# Patient Record
Sex: Female | Born: 1935 | Race: White | Hispanic: No
Health system: Southern US, Community
[De-identification: ages and names within clinical notes are randomized; demographics above are authoritative.]

---

## 1999-05-26 ENCOUNTER — Inpatient Hospital Stay: Admission: RE | Admit: 1999-05-26 | Discharge: 1999-05-27 | Payer: Self-pay | Admitting: *Deleted

## 2004-03-04 ENCOUNTER — Ambulatory Visit: Payer: Self-pay | Admitting: Internal Medicine

## 2004-07-12 ENCOUNTER — Ambulatory Visit: Payer: Self-pay | Admitting: Internal Medicine

## 2004-07-29 ENCOUNTER — Ambulatory Visit: Payer: Self-pay | Admitting: Gastroenterology

## 2005-04-26 ENCOUNTER — Ambulatory Visit: Payer: Self-pay | Admitting: Internal Medicine

## 2005-05-31 ENCOUNTER — Ambulatory Visit: Payer: Self-pay | Admitting: Internal Medicine

## 2005-07-04 ENCOUNTER — Ambulatory Visit: Payer: Self-pay | Admitting: Gastroenterology

## 2006-09-07 ENCOUNTER — Ambulatory Visit: Payer: Self-pay | Admitting: Internal Medicine

## 2007-07-05 ENCOUNTER — Ambulatory Visit: Payer: Self-pay | Admitting: Internal Medicine

## 2008-07-16 ENCOUNTER — Ambulatory Visit: Payer: Self-pay | Admitting: Internal Medicine

## 2009-12-26 ENCOUNTER — Emergency Department: Payer: Self-pay | Admitting: Emergency Medicine

## 2010-06-10 ENCOUNTER — Ambulatory Visit: Payer: Self-pay | Admitting: Ophthalmology

## 2010-06-10 DIAGNOSIS — I119 Hypertensive heart disease without heart failure: Secondary | ICD-10-CM

## 2010-06-28 ENCOUNTER — Ambulatory Visit: Payer: Self-pay | Admitting: Ophthalmology

## 2013-08-23 ENCOUNTER — Ambulatory Visit (HOSPITAL_COMMUNITY)
Admission: EM | Admit: 2013-08-23 | Discharge: 2013-08-23 | Disposition: A | Discharge status: Home / Self Care | Payer: Medicare HMO | Source: Other Acute Inpatient Hospital | Attending: *Deleted | Admitting: *Deleted

## 2013-08-23 ENCOUNTER — Other Ambulatory Visit: Payer: Self-pay | Admitting: Urgent Care

## 2013-08-23 ENCOUNTER — Emergency Department: Payer: Self-pay | Admitting: Emergency Medicine

## 2013-08-23 ENCOUNTER — Ambulatory Visit: Payer: Self-pay | Admitting: Urgent Care

## 2013-08-23 DIAGNOSIS — I714 Abdominal aortic aneurysm, without rupture, unspecified: Secondary | ICD-10-CM | POA: Insufficient documentation

## 2013-08-23 LAB — CBC WITH DIFFERENTIAL/PLATELET
BASOS ABS: 0.1 10*3/uL (ref 0.0–0.1)
Basophil %: 1.2 %
EOS PCT: 2.6 %
Eosinophil #: 0.2 10*3/uL (ref 0.0–0.7)
HCT: 28.1 % — ABNORMAL LOW (ref 35.0–47.0)
HGB: 8.9 g/dL — ABNORMAL LOW (ref 12.0–16.0)
LYMPHS ABS: 1.2 10*3/uL (ref 1.0–3.6)
Lymphocyte %: 14.7 %
MCH: 29.5 pg (ref 26.0–34.0)
MCHC: 31.6 g/dL — ABNORMAL LOW (ref 32.0–36.0)
MCV: 93 fL (ref 80–100)
MONO ABS: 0.7 x10 3/mm (ref 0.2–0.9)
Monocyte %: 9.4 %
Neutrophil #: 5.7 10*3/uL (ref 1.4–6.5)
Neutrophil %: 72.1 %
Platelet: 243 10*3/uL (ref 150–440)
RBC: 3.02 10*6/uL — AB (ref 3.80–5.20)
RDW: 14.4 % (ref 11.5–14.5)
WBC: 7.9 10*3/uL (ref 3.6–11.0)

## 2013-08-23 LAB — COMPREHENSIVE METABOLIC PANEL
AST: 25 U/L (ref 15–37)
Albumin: 3.7 g/dL (ref 3.4–5.0)
Alkaline Phosphatase: 106 U/L
Anion Gap: 4 — ABNORMAL LOW (ref 7–16)
BUN: 38 mg/dL — AB (ref 7–18)
Bilirubin,Total: 0.4 mg/dL (ref 0.2–1.0)
CALCIUM: 9.9 mg/dL (ref 8.5–10.1)
CHLORIDE: 107 mmol/L (ref 98–107)
CREATININE: 1.45 mg/dL — AB (ref 0.60–1.30)
Co2: 26 mmol/L (ref 21–32)
EGFR (Non-African Amer.): 31 — ABNORMAL LOW
GFR CALC AF AMER: 36 — AB
GLUCOSE: 110 mg/dL — AB (ref 65–99)
Osmolality: 284 (ref 275–301)
POTASSIUM: 4.4 mmol/L (ref 3.5–5.1)
SGPT (ALT): 16 U/L (ref 12–78)
Sodium: 137 mmol/L (ref 136–145)
Total Protein: 7.6 g/dL (ref 6.4–8.2)

## 2013-09-09 ENCOUNTER — Inpatient Hospital Stay: Payer: Self-pay | Admitting: Internal Medicine

## 2013-09-09 LAB — COMPREHENSIVE METABOLIC PANEL
ALBUMIN: 2.6 g/dL — AB (ref 3.4–5.0)
ALT: 9 U/L — AB (ref 12–78)
ANION GAP: 6 — AB (ref 7–16)
Alkaline Phosphatase: 92 U/L
BILIRUBIN TOTAL: 0.5 mg/dL (ref 0.2–1.0)
BUN: 31 mg/dL — ABNORMAL HIGH (ref 7–18)
Calcium, Total: 9.4 mg/dL (ref 8.5–10.1)
Chloride: 105 mmol/L (ref 98–107)
Co2: 25 mmol/L (ref 21–32)
Creatinine: 1.43 mg/dL — ABNORMAL HIGH (ref 0.60–1.30)
EGFR (African American): 37 — ABNORMAL LOW
GFR CALC NON AF AMER: 32 — AB
Glucose: 93 mg/dL (ref 65–99)
Osmolality: 278 (ref 275–301)
Potassium: 4.5 mmol/L (ref 3.5–5.1)
SGOT(AST): 12 U/L — ABNORMAL LOW (ref 15–37)
Sodium: 136 mmol/L (ref 136–145)
Total Protein: 7 g/dL (ref 6.4–8.2)

## 2013-09-09 LAB — CBC
HCT: 22 % — AB (ref 35.0–47.0)
HGB: 7 g/dL — ABNORMAL LOW (ref 12.0–16.0)
MCH: 28.2 pg (ref 26.0–34.0)
MCHC: 31.7 g/dL — ABNORMAL LOW (ref 32.0–36.0)
MCV: 89 fL (ref 80–100)
Platelet: 239 10*3/uL (ref 150–440)
RBC: 2.48 10*6/uL — ABNORMAL LOW (ref 3.80–5.20)
RDW: 15 % — ABNORMAL HIGH (ref 11.5–14.5)
WBC: 13.4 10*3/uL — ABNORMAL HIGH (ref 3.6–11.0)

## 2013-09-09 LAB — FERRITIN: Ferritin (ARMC): 107 ng/mL (ref 8–388)

## 2013-09-09 LAB — TROPONIN I: Troponin-I: <0.02 ng/mL

## 2013-09-09 LAB — IRON AND TIBC
Iron Bind.Cap.(Total): 259 ug/dL (ref 250–450)
Iron Saturation: 3 %
Iron: 9 ug/dL — ABNORMAL LOW (ref 50–170)
Unbound Iron-Bind.Cap.: 250 ug/dL

## 2013-09-09 LAB — RETICULOCYTES
Absolute Retic Count: 0.051 10*6/uL (ref 0.019–0.186)
Reticulocyte: 2 % (ref 0.4–3.1)

## 2013-09-09 LAB — URIC ACID: Uric Acid: 4.6 mg/dL (ref 2.6–6.0)

## 2013-09-09 LAB — LACTATE DEHYDROGENASE: LDH: 146 U/L (ref 81–246)

## 2013-09-10 LAB — CBC WITH DIFFERENTIAL/PLATELET
Basophil #: 0 10*3/uL (ref 0.0–0.1)
Basophil %: 0.2 %
EOS ABS: 0.2 10*3/uL (ref 0.0–0.7)
Eosinophil %: 1.7 %
HCT: 24.7 % — ABNORMAL LOW (ref 35.0–47.0)
HGB: 8.1 g/dL — ABNORMAL LOW (ref 12.0–16.0)
Lymphocyte #: 1.2 10*3/uL (ref 1.0–3.6)
Lymphocyte %: 9.9 %
MCH: 28.6 pg (ref 26.0–34.0)
MCHC: 32.7 g/dL (ref 32.0–36.0)
MCV: 88 fL (ref 80–100)
Monocyte #: 1.3 x10 3/mm — ABNORMAL HIGH (ref 0.2–0.9)
Monocyte %: 10.4 %
Neutrophil #: 9.6 10*3/uL — ABNORMAL HIGH (ref 1.4–6.5)
Neutrophil %: 77.8 %
Platelet: 199 10*3/uL (ref 150–440)
RBC: 2.82 10*6/uL — ABNORMAL LOW (ref 3.80–5.20)
RDW: 14.7 % — AB (ref 11.5–14.5)
WBC: 12.3 10*3/uL — ABNORMAL HIGH (ref 3.6–11.0)

## 2013-09-10 LAB — OCCULT BLOOD X 1 CARD TO LAB, STOOL: OCCULT BLOOD, FECES: NEGATIVE

## 2013-09-10 LAB — BASIC METABOLIC PANEL
ANION GAP: 5 — AB (ref 7–16)
BUN: 26 mg/dL — ABNORMAL HIGH (ref 7–18)
CO2: 24 mmol/L (ref 21–32)
CREATININE: 1.29 mg/dL (ref 0.60–1.30)
Calcium, Total: 9.1 mg/dL (ref 8.5–10.1)
Chloride: 107 mmol/L (ref 98–107)
EGFR (African American): 42 — ABNORMAL LOW
EGFR (Non-African Amer.): 36 — ABNORMAL LOW
GLUCOSE: 101 mg/dL — AB (ref 65–99)
Osmolality: 277 (ref 275–301)
POTASSIUM: 3.9 mmol/L (ref 3.5–5.1)
Sodium: 136 mmol/L (ref 136–145)

## 2013-09-10 LAB — SEDIMENTATION RATE: Erythrocyte Sed Rate: >140 mm/hr — ABNORMAL HIGH (ref 0–30)

## 2013-09-10 LAB — FOLATE: FOLIC ACID: 28.4 ng/mL (ref 3.1–100.0)

## 2013-09-11 LAB — CBC WITH DIFFERENTIAL/PLATELET
BASOS PCT: 0.1 %
Basophil #: 0 10*3/uL (ref 0.0–0.1)
EOS ABS: 0 10*3/uL (ref 0.0–0.7)
Eosinophil %: 0 %
HCT: 25.8 % — ABNORMAL LOW (ref 35.0–47.0)
HGB: 8.4 g/dL — AB (ref 12.0–16.0)
Lymphocyte #: 0.5 10*3/uL — ABNORMAL LOW (ref 1.0–3.6)
Lymphocyte %: 5.7 %
MCH: 28.8 pg (ref 26.0–34.0)
MCHC: 32.5 g/dL (ref 32.0–36.0)
MCV: 88 fL (ref 80–100)
Monocyte #: 0.1 x10 3/mm — ABNORMAL LOW (ref 0.2–0.9)
Monocyte %: 1.5 %
NEUTROS PCT: 92.7 %
Neutrophil #: 8.5 10*3/uL — ABNORMAL HIGH (ref 1.4–6.5)
Platelet: 198 10*3/uL (ref 150–440)
RBC: 2.91 10*6/uL — ABNORMAL LOW (ref 3.80–5.20)
RDW: 15.1 % — ABNORMAL HIGH (ref 11.5–14.5)
WBC: 9.2 10*3/uL (ref 3.6–11.0)

## 2013-09-14 LAB — CULTURE, BLOOD (SINGLE)

## 2013-10-09 ENCOUNTER — Telehealth: Payer: Self-pay

## 2013-10-09 NOTE — Telephone Encounter (Signed)
Patient died per Obituary °

## 2013-10-26 DEATH — deceased

## 2014-07-19 NOTE — H&P (Signed)
PATIENT NAME:  Renee Jarvis, Renee Jarvis MR#:  [id number] DATE OF BIRTH:  Jan 05, 1919  DATE OF ADMISSION:  09/09/2013  ADMITTING PHYSICIAN: Renee Baas, MD   PRIMARY CARE PHYSICIAN: Renee Corporal, MD  CHIEF COMPLAINT: Left wrist swelling and weakness.   HISTORY OF PRESENT ILLNESS: Renee Jarvis is a 81 year old elderly Caucasian female with past medical history significant for hypertension, hyperlipidemia, hypothyroidism, reflux disease, who was recently here in the Emergency Room for abdominal pain. CT showed large 8 cm descending thoracic aortic aneurysm and was transferred over to [facility] at the time because of her age and multiple medical problems. They just recommended hospice and observation. The patient was living independently prior to this and since then she moved over with her niece, who is also her healthcare power of attorney. The patient has been ambulating with a walker, however, over the last couple of days she has been extremely weak. She tried to get up to go to the bathroom 2 days back and almost passed out due to dizziness at the time. She was told that she was not having any leaking or bleeding from her aneurysm at [facility].  Her hemoglobin in the ER visit last month, about 3 weeks, ago was 8.9. She has been having some left wrist swelling that she noticed this morning, unable to flex or extend the wrist at this time, so brought over to the ER. She is also noted to have a hemoglobin of 7 today. She is being admitted for possible wrist cellulitis and also acute on chronic anemia.   PAST MEDICAL HISTORY: 1.  Gastroesophageal reflux disease.  2.  Hiatal hernia.  3.  Descending thoracic aortic aneurysm.  4.  Left carotid stenosis.  5.  Peripheral vascular disease, status post left femoral stent.  6.  Hypertension.  7.  Hyperlipidemia.  8.  Anemia of chronic disease.  9.  Hypothyroidism.  10.  Diet-controlled diabetes mellitus.  11.  Chronic right shoulder pain from rotator cuff tear.   PAST  SURGICAL HISTORY: 1.  Stent placement in left leg. 2.  Oophorectomy.  3.  Appendectomy.   ALLERGIES TO MEDICATIONS: No known drug allergies.   CURRENT HOME MEDICATIONS:  1.  Metoprolol 12.5 mg p.o. b.i.d.  2.  Morphine unknown dose, 1 tablet daily.  3.  Omeprazole 20 mg p.o. daily.  4.  Synthroid 88 mcg p.o. daily.   SOCIAL HISTORY: Living with niece, uses walker at baseline. Remote history of smoking, quit more than 15 years back. No alcohol use.   FAMILY HISTORY: Significant for heart disease in the family and sister also had abdominal aortic aneurysm.    REVIEW OF SYSTEMS:  CONSTITUTIONAL: No fever. Positive for fatigue and weakness.  EYES: No blurred vision, double vision, inflammation or glaucoma.  ENT: No tinnitus, ear pain, hearing loss, epistaxis or discharge.  RESPIRATORY: No cough, wheeze, hemoptysis or chronic obstructive pulmonary disease.  CARDIOVASCULAR: No chest pain, orthopnea, edema, arrhythmia, palpitations or syncope.  GASTROINTESTINAL: No nausea, vomiting, diarrhea, abdominal pain, hematemesis or melena.  GENITOURINARY: No dysuria, hematuria, renal calculus, frequency or incontinence.  ENDOCRINE: No polyuria, nocturia, thyroid problems, heat or cold intolerance.  HEMATOLOGY: Positive for anemia.  No easy bruising or bleeding.  SKIN: No acne, rash or lesions.  MUSCULOSKELETAL: Positive for left wrist pain, osteoporosis and significant arthritis.  NEUROLOGICAL: No numbness, weakness, cerebrovascular accident, transient ischemic attack or seizures.  PSYCHOLOGIC: No anxiety, insomnia, depression.   PHYSICAL EXAMINATION: VITAL SIGNS: Temperature 99.3 degrees Fahrenheit, pulse 101, respirations 22,  blood pressure 144/83, pulse ox 100% on oxygen.  GENERAL: Thin-built, ill-nourished elderly female sitting in bed, not in any acute distress.  HEENT: Normocephalic, atraumatic. Pupils equal, round, reacting to light. Anicteric sclerae. Extraocular movements intact.  Oropharynx clear without erythema, mass or exudates.  NECK: Supple, nontender.  No JVD or carotid bruits.  No lymphadenopathy.  LUNGS: Moving air bilaterally. No wheeze or crackles. No use of accessory muscles for breathing.  CARDIOVASCULAR: S1, S2. There is a 3/6 systolic murmur. No rubs or gallops.  ABDOMEN: Soft, nontender, nondistended. No hepatosplenomegaly. Normal bowel sounds.  EXTREMITIES: No pedal edema. No clubbing or cyanosis; 2+ dorsalis pedis pulses palpable bilaterally. Left wrist is swollen and also the left fingers. No erythema noted. Tenderness on the  wrist, especially on the palmar side and also tender to touch in the thenar eminence.  NEUROLOGIC: Cranial nerves intact. No focal motor or sensory deficits.  PSYCHOLOGICAL: The patient is alert, awake, oriented x 3.   LABORATORY, DIAGNOSTIC AND RADIOLOGICAL DATA:  1.  WBC 13.4, hemoglobin 7.3, hematocrit 22.0, platelet count 239.  2.  Sodium 136, potassium 4.5, chloride 105, bicarbonate 25, BUN 31, creatinine 1.4, glucose 93 and calcium of 9.4.  3.  ALT 9, AST 12, alkaline phosphatase 92, total bilirubin 0.4, albumin of 3.6. Uric acid 4.6. Troponin less than 0.02.  4.  Wrist x-ray showing moderate degenerative changes without acute fracture.  5.  Ultrasound of the left upper extremity showing no evidence of any deep venous thrombosis.   ASSESSMENT AND PLAN: A 81 year old elderly female with thoracic aneurysm, recently-diagnosed anemia of chronic disease, reflux, peripheral vascular disease, followed by [facility], brought in for weakness, dizziness, left wrist swelling and pain.  1.  Left wrist swelling, Wrist X ray with arthritis changes. Ultrasound is negative for clot. Either pseudogout or cellulitis. Blood cultures ordered.  IV Ancef. If no improvement, steroids likely.  2.  Anemia, acute on chronic. Last month was 8.9, now it is 7.0. Takes Excedrin for arthritis p.r.n. Placed on IV Protonix. Gastroenterology consult.  Not  sure if she is a good candidate for any procedures. One unit transfusion, recheck. If hemoglobin is stable and no active bleed, likely discharge.  3.  Abdominal aortic aneurysm, descending thoracic aneurysm. No bleeding or leaking. Followed by hospice at home.  4.  Hypothyroidism. On Synthroid.  5.  Hypertension. Continue home medications.  6.  CODE STATUS: DO NOT RESUSCITATE. Discussed with her niece at bedside.   TIME SPENT ON ADMISSION: 50 minutes.    ____________________________ Renee Baasadhika Meryl Hubers, MD rk:cs D: 09/09/2013 20:08:00 ET T: 09/09/2013 20:35:10 ET JOB#: [id number]  cc: Renee Baasadhika Helmer Dull, MD, <Dictator> Renee CorporalFayegh H. Jadali, MD Renee BaasADHIKA Dnya Hickle MD ELECTRONICALLY SIGNED 09/16/2013 15:59

## 2014-07-19 NOTE — Consult Note (Signed)
Brief Consult Note: Patient was seen by consultant.   Consult note dictated.   Orders entered.   Comments: 1 acute left wrist and hand synovitis,chondrocalcinosis on xray, c/w pseudogout 2 anemia rec wrist splint, prednisone dose x1, may need taper.  Electronic Signatures: Royann ShiversKernodle, Jr., Helen HashimotoGeorge Wallace (MD)  (Signed 16-Jun-15 19:05)  Authored: Brief Consult Note   Last Updated: 16-Jun-15 19:05 by Royann ShiversKernodle, Jr., Helen HashimotoGeorge Wallace (MD)

## 2014-07-19 NOTE — Consult Note (Signed)
Brief Consult Note: Diagnosis: History of melena for 3-4 days 3 weeks ago, acute on chronic anemia.   Patient was seen by consultant.   Consult note dictated.   Comments: Maximize ppi bid. Hold on carafate as SE is constipation. Keep Hgb >8. Monitor labs. No NSAIDS. She is not a luminal candidate. This case d/w Dr. Mechele CollinElliott in collaboration of care.  Electronic Signatures: Rowan BlaseMills, Tamsen Reist Ann (NP)  (Signed 16-Jun-15 14:08)  Authored: Brief Consult Note   Last Updated: 16-Jun-15 14:08 by Rowan BlaseMills, Lewi Drost Ann (NP)

## 2014-07-19 NOTE — Consult Note (Signed)
PATIENT NAME:  Renee Jarvis, Renee Jarvis MR#:  [id number] DATE OF BIRTH:  02-Sep-1918  DATE OF CONSULTATION:  09/10/2013  REFERRING PHYSICIAN:  Enid Baas, MD CONSULTING PHYSICIAN: Lynnae Prude, MD / Ranae Plumber. Arvilla Market, ANP (Adult Nurse Practitioner)  PRIMARY CARE PHYSICIAN: Sherrie Mustache, MD  REASON FOR CONSULTATION: Melena with anemia.   HISTORY OF PRESENT ILLNESS: This 81 year old patient with history of hypertension, hyperlipidemia, hypothyroidism, and an 8 cm descending thoracic aortic aneurysm was brought to the Emergency Room by her family for left arm pain. The patient has also had progressive weakness over the last couple of days. The patient has had no specific injury to the hand. She has had evaluation with negative findings. The hand pain is so severe that it does radiate up the arm, and it has required the patient to have pain medication. This patient was extremely functional until about 3 weeks ago. She then developed problems with this arm pain. She also tells me she passed black stool about 4 weeks ago that she noticed for about 3 or 4 days and then it spontaneously cleared. During the time with the black stool, she had mild abdominal discomfort that would wax and wane. Her great niece who is present, reports the patient has reported intermittent left lower quadrant abdominal discomfort for years. No recent exacerbation. The patient was hospitalized here 08/23/2013 and had a hemoglobin of 8.9. She presented this admission with a hemoglobin of 7. The family tells me her rectal exam in the Emergency Room was negative for blood. The patient currently denies any abdominal complaints. She has received 1 unit PRBC for iron deficiency anemia and says she is feeling better. GI has been asked to see her regarding further evaluation and management.   The patient does have a history of adenomatous colon polyps and her most recent colonoscopy was performed in 2006. She aged out of surveillance  colonoscopy.   PAST MEDICAL HISTORY: 1.  Gastroesophageal reflux disease.  2.  Hiatal hernia.  3.  Descending thoracic aortic aneurysm, 8 cm, recently evaluated by Renee Jarvis with recommendations for hospice care.  4.  Left carotid stenosis.  5.  Peripheral vascular disease, status post left femoral stent.  6.  Hypertension.  7.  Hyperlipidemia.  8.  Anemia of chronic disease.  9.  Hypothyroidism.  10.  Diet-controlled diabetes mellitus.  11.  Chronic right shoulder pain from rotator cuff tear. 12.  Adenomatous colon polyps including a serrated adenoma from the rectum, 2006.   PAST SURGICAL HISTORY:  1.  Stent placement, left leg.  2.  Oophorectomy.  3.  Appendectomy.   CURRENT HOME MEDICATIONS:  1.  Metoprolol 12.5 mg twice daily.  2.  Morphine, unknown dose, p.r.n. pain, hospice medication.  3.  Omeprazole 20 mg daily.  4.  Synthroid 88 mcg daily.  5.  Lorazepam p.r.n., hospice medication.   ALLERGIES: No known drug allergies.   SOCIAL HISTORY: Living with niece. Uses walker at baseline. Remote history of tobacco. Negative alcohol.   FAMILY HISTORY: Aunt with history of cancer, possible breast cancer. No family history of colon cancer.   REVIEW OF SYSTEMS: Ten systems reviewed. Positive for fatigue, weakness. No chest pain, shortness of breath. Left breast, chest, arm, shoulder and hand discomfort, some swelling of the arm, has been investigated with negative DVT, negative fracture. No orthopedic evaluation. The patient denies injury or fall. Positive for weakness and fatigue and decreased functionality requiring significant help by her niece. The patient has recently been placed  in hospice care as her niece was having difficulty meeting all of her needs at home. No surgical recommendation for the patient's aneurysm.   PHYSICAL EXAMINATION: VITAL SIGNS: 98.1, 74, 18, 142/99, pulse oximetry 97% on 3 liters nasal cannula.  GENERAL: Elderly, frail very thin Caucasian female  resting in bed. She is actively involved in conversation and visiting with her great-niece.  HEENT: Head is normocephalic. Conjunctiva is pale pink.  NECK: Supple. Trachea is midline.  LUNGS: Have good breath sounds bilateral, no wheezing or crackles or rhonchi noted.  CARDIOVASCULAR: S1, S2. Positive systolic murmur is present.  ABDOMEN: Soft and nondistended, nontender in all quadrants. Negative organomegaly. Digital rectal exam deferred.  EXTREMITIES: Lower extremities without edema, some muscle mass wasting noted.  NEUROLOGIC: The patient's speech is clear. She is very alert, oriented, good historian, weakly moves about in bed.   DIAGNOSTIC DATA: Serum iron is 9. BUN 31, creatinine 1.43, albumin 2.6, AST 12, total bilirubin 0.5, alkaline phosphatase 92. Uric acid 4.6, normal range. Ferritin 107, iron saturation 3%. LDH 146. Troponin negative x1. WBC 13.4, hemoglobin 7, hematocrit 22, platelet count 239,000, MCV 89, MCH 28, RDW 15. Retic count 2. Direct Coombs negative. Blood culture negative x2.   Repeat laboratory studies today with BUN 26, creatinine 1.29. After 1 unit PRBC, hemoglobin is up to 8.1. WBCs are 12.3.   Radiology: Left wrist x-ray negative for fracture. Positive for moderate degenerative change.   Ultrasound of the left arm: Negative for DVT.  CT of the abdomen and pelvis without contrast performed in ER visit 08/23/2013 notable for a descending thoracic aorta measuring 8.1 x 7.2 cm, which was a dramatic change compared to the prior study. There is aneurysmal dilatation in the distal abdominal aorta with tortuosity. The maximal transverse diameter in the triple-A is 3.6 x 3.2 cm. There is a cystic mass between the uterus and rectum measuring 8.1 x 7.5 cm. Multiple sigmoid diverticula without diverticulosis. No bowel obstruction. There is extensive arthropathy throughout the lumbar spine. No blastic or lytic bone lesions. The patient was subsequently referred to [facility] and reportedly  had a second  CT of the abdomen with contrast at that facility and the niece reports no evidence of aneurysm leaking.   IMPRESSION: The patient presents with a history of melena about 4 weeks ago. This correlates with her 3 week history of decreased functional status, increasing weakness, fatigue, and needing more assistance at home. The patient gives a good history that she had about 3 to 4 days of black stools that spontaneously resolved back to normal. She has had some mild intermittent left lower quadrant discomfort for many years and that is unchanged. She presents today asymptomatic. CT studies have made no mention of abnormality in the bowel. The patient did have some upper left chest, arm, and shoulder discomfort that has been her main chief complaint recently. That does not seem to be gastrointestinally related. She has had slight decrease in appetite, staff reports she is not eating well and her albumin is lower than baseline. The patient denies any nausea or vomiting, heartburn or reflux or upper abdominal pain at this time. The etiology for her melena a few weeks ago certainly could be an upper gastrointestinal focus. The patient has multiple vascular problems and that can give her risk for vascular gastrointestinal blood loss. She has noted no maroon or bright red blood. She has not had a bowel movement since admission. She is having a tendency towards constipation which is a  waxing and waning problem for her. She had her last colonoscopy in 2006 with history of adenomatous polyps and a serrated adenomatous polyp in the rectum. Possibility of colorectal neoplasm as etiology. Her iron deficiency anemia needs to be considered. The patient is now on hospice care as she is deemed not to be a surgical candidate given advanced age and overall health.   PLAN: Recommend aggressive treatment for potential of upper gastrointestinal bleed with double dose proton pump inhibitor, consider Carafate if needed.  She has received 1 unit PRBC and hemoglobin has improved. Would recommend monitoring hemoglobin and iron supplementation to try to build back up her blood if she is able to tolerate the medication. She is not a luminal candidate in the absence of aggressive active gastrointestinal bleed. I will discuss this case with Dr. Mechele CollinElliott in collaboration of care. Thank you for the consultation.   This services provided by Cala BradfordKimberly A. Arvilla MarketMills, MS, APRN, BC, ANP under collaborative agreement with Lynnae Prudeobert Elliott, MD.   ____________________________ Ranae PlumberKimberly A. Arvilla MarketMills, ANP (Adult Nurse Practitioner) kam:sb D: 09/10/2013 13:52:59 ET T: 09/10/2013 14:13:51 ET JOB#: [id number]  cc: Cala BradfordKimberly A. Arvilla MarketMills, ANP (Adult Nurse Practitioner), <Dictator> Ranae PlumberKimberly A. Suzette BattiestMills RN, MSN, ANP-BC Adult Nurse Practitioner ELECTRONICALLY SIGNED 09/11/2013 8:47

## 2014-07-19 NOTE — Consult Note (Signed)
PATIENT NAME:  Renee Jarvis, Renee Jarvis MR#:  [id number] DATE OF BIRTH:  1918-12-25  DATE OF CONSULTATION:  09/10/2013  CONSULTING PHYSICIAN:  Dessie ComaGeorge Wallace Kernodle Jr., MD  REASON FOR CONSULT: Left wrist pain.   REPORT OF CONSULTATION: A 81 year old white female. She has a history of thoracic aneurysm, reflux, diabetes. She has been on hospice care. She has had prior right rotator cuff surgery. She felt bad in the last few days, called the hospice nurse, who stayed up with her one night. Left wrist started swelling. No significant fever. Right hand did not swell. No knee swelling or foot swelling. She has not had podagra. She thinks she may have had this before. She came to the Emergency Room, where she was more anemic. She does have a thoracic aneurysm. She had an x-ray of the left hand. She has not had significant fever. She has had evaluation by GI. She has not had any procedures. Her white count was 12,000, hemoglobin 8, platelets 199,000, creatinine 1.29, sedimentation rate 140, uric acid 4. X-ray was read as degenerative changes.  PAST MEDICAL HISTORY: Aneurysm reflux, diabetes, peripheral vascular disease, hypertension, rotator cuff disease.  REVIEW OF SYSTEMS:  Otherwise negative.   PHYSICAL EXAMINATION: GENERAL: A pleasant female. No acute distress. Unaccompanied in the room.  VITAL SIGNS: Temperature is 97, blood pressure 142/90, respiratory rate 18, pulse ox 96%, pulse 66.  HEENT: Sclerae clear.  CHEST: Clear.  HEART: No significant murmur.  ABDOMEN: Nontender.  EXTREMITIES: No significant edema.  MUSCULOSKELETAL: Decreased range of cervical spine. The right shoulder has decreased abduction, external rotation, but no effusion. Left shoulder without effusion. Right hand without synovitis. Hypertrophic changes thumb CMC. Left hand with synovitis, left wrist. The PIPs and MCPs were swollen, the hands diffusely swell. She has pain with flexion and extension. There is minimal erythema. She has  good distal pulses. Hips move well. Knees without effusions. MTPs nontender.   STUDIES: X-ray reviewed by me shows degenerative changes radiocarpal joint and thumb CMC, but she has calcification of the lateral wrist consistent with chondrocalcinosis.   IMPRESSION: 1.  Acute left wrist and hand tenosynovitis, most consistent with pseudogout, CPPD.  2.  Anemia, with recent worsening.   3.  Old right rotator cuff disease and peripheral vascular disease.   PLAN: I do not think it is imperative that she have an aspiration. Injection of the wrist would only help the wrist, it would not help the rest of the hand, so would favor wrist splinting and  a steroid taper. She has PPI protection.    ____________________________ Dessie ComaGeorge Wallace Kernodle Jr., MD gwk:mr D: 09/10/2013 19:10:55 ET T: 09/10/2013 19:43:25 ET JOB#: [id number]  cc: Dessie ComaGeorge Wallace Kernodle Jr., MD, <Dictator> Webb SilversmithGEORGE W KERNODLE J MD ELECTRONICALLY SIGNED 09/11/2013 12:50

## 2014-07-19 NOTE — Discharge Summary (Signed)
PATIENT NAME:  Renee Jarvis, Renee Jarvis MR#:  [id number] DATE OF BIRTH:  1918-04-04  DATE OF ADMISSION:  09/09/2013 DATE OF DISCHARGE:  09/11/2013  ADMITTING DIAGNOSIS: Arthritis versus cellulitis.   DISCHARGE DIAGNOSES: 1.  Left hand wrist synovitis, suspected  pseudogout.  2.  Acute on chronic posthemorrhagic anemia, status post 1 unit of packed red blood cell transfusion during this admission.  3.  Gastrointestinal bleed of unclear etiology at this time.  4.  Acute renal failure due to dehydration, resolved on IV fluid administration.  5.  Moderate malnutrition. 6.  Malignant essential hypertension.  7.  History of thoracic aortic aneurysm.   8.  Hypothyroidism.   DISCHARGE CONDITION: Stable.   DISCHARGE MEDICATIONS: The patient is to continue:  1.  Synthroid 88 mcg p.o. daily.  2.  Metoprolol tartrate 12.5 mg twice daily.  3.  Morphine 1 tablet daily as needed.  4.  Omeprazole 40 mg p.o. twice daily.  5.  Colchicine 0.6 mg p.o. daily.  6.  Amlodipine 2.5 mg p.o. daily.  7.  Prednisone taper at 60 mg p.o. once on September 12, 2013, then taper x 10 mg every 2 days until stopped.  8.  Iron gluconate 240 mg twice daily.  9.  Colace 100 mg p.o. twice daily.  10.  Senna 8.6 mg once daily at as needed.   HOME OXYGEN: None.   DIET: Two grams salt, low-fat, low-cholesterol with Ensure 3 times a day. Regular consistency.   ACTIVITY LIMITATIONS: As tolerated.   REFERRAL: To hospice at home.    FOLLOWUP APPOINTMENT: With Dr. Dario GuardianJadali in 2 days after discharge.   CONSULTANTS: Care management, social work, Dr. Gavin PottersKernodle.   RADIOLOGIC STUDIES: Left wrist complete x-ray, September 09, 2013, revealing moderate degenerative changes of the wrist without acute fracture. Doppler ultrasound of left upper extremity September 09, 2013, revealing no evidence of deep vein thrombosis.   HISTORY OF PRESENT ILLNESS: The patient is a 81 year old Caucasian female with past medical history significant for history of thoracic  aortic aneurysm reaching 8 cm, history of hypertension, hyperlipidemia, who is under hospice care at home, presents to the hospital with complaints of left wrist swelling as well as weakness. Please refer to Dr. Prudencio PairKalisetti's admission note on September 09, 2013. On arrival to the hospital, the patient was noted to be anemic with a hemoglobin level of 7. She also admits of having black-looking stools a few weeks ago. On arrival to the Emergency Room, the patient's vital signs:  Temperature was 99.8, pulse was 101, respiration rate was 22, blood pressure 144/83, saturation was 100% on oxygen therapy. Physical examination was remarkable for 3 out of 6 systolic murmur on heart exam. Otherwise, the patient also showed left wrist swelling  as well as MCP swelling and hand was diffusely swollen, also painfull with flexion as well as extension and no erythema. The patient was admitted to the hospital for further evaluation. Her blood cultures were taken and she was initiated on antibiotic therapy because of concerns of possible cellulitis.   On arrival to the hospital, the patient's labs revealed elevation of BUN as well as creatinine to 31 and 1.43, otherwise BMP was unremarkable. The patient's iron level was low at 9, iron saturation was noted to be only 3, ferritin level of 107. Liver enzymes were remarkable for low albumin of  2.6. White blood cell count was elevated at 13.4, hemoglobin was 7.0 as mentioned above and platelet count was 239. Reticulocyte count was normal at 2.0,  absolute reticulocyte count was 0.051.   HOSPITAL COURSE:  The patient was admitted to the hospital for further evaluation and evaluation of her anemia as well as treatment of her left hand and wrist swelling and discomfort. The patient was evaluated by Dr. Gavin Potters for left wrist swelling and decreased range of motion and pain. Dr. Gavin Potters saw the patient in consultation on 09/10/2013. He felt that the patient has acute left wrist as well as hand  tenosynovitis most consistent with pseudogout CPPD. He gave the patient prednisone and recommended wrist splinting.  The patient was initiated on prednisone as well as colchicine with good results. She is to continue prednisone taper. She is to continue colchicine, closely following her kidney function. She is to follow up with her primary physician for further recommendations.   In regards to anemia, we consulted gastroenterologist, Dr. Mechele Collin.  Dr. Mechele Collin did not feel the patient is a candidate for luminal investigation. He recommended to treat patient as if she has peptic ulcer disease with proton pump inhibitor. The patient was advised not to use any nonsteroidal anti-inflammatory medications. She is being discharged to home with omeprazole 40 mg twice-daily dose. She was also recommended to continue iron supplementation. She is to follow up with her primary care physician and have her labs rechecked.   In regards to moderate malnutrition, the patient was advised to use Ensure. In regards to malignant essential hypertension, the patient's blood pressure medications were advanced, adding 2.5 mg of amlodipine daily with good results.   On the day of discharge, the patient's vital signs: Temperature was 98.5, pulse was 90, respiratory rate was 19, blood pressure 130/80, saturation was 98% on 3 liters of oxygen through nasal cannula.   In regards to renal insufficiency and dehydration, it was felt to be dehydration-related and  resolved on IV fluid administration. For her chronic medical problems such as thoracic aortic aneurysm as well as hypothyroidism, the patient is to continue her outpatient management and hospice care. The patient is being discharged in stable condition with the above-mentioned medications and followup.   TIME SPENT: 40 minutes on this patient.   ____________________________ Katharina Caper, MD rv:cs D: 09/11/2013 19:45:36 ET T: 09/11/2013 20:05:46 ET JOB#: [id number]  cc: Katharina Caper, MD, <Dictator> Marlyn Corporal, MD RIMA VAICKUTE MD ELECTRONICALLY SIGNED 09/21/2013 18:44

## 2014-07-19 NOTE — Consult Note (Signed)
Pt seen, case discussed with Fransico SettersKim Mills, NP.  Pt with recent melena for 3-4 days, no vomiting, no current bleeding.  Conjunctiva look pale. May need more blood.  No plans to do EGD given age and multiple aneurysms.  Treat as if PUD  with PPI.  Electronic Signatures: Scot JunElliott, Robert T (MD)  (Signed on 16-Jun-15 17:22)  Authored  Last Updated: 16-Jun-15 17:22 by Scot JunElliott, Robert T (MD)

## 2016-04-19 IMAGING — CT CT ABD-PELV W/O CM
2 of 4 series · 15 of 46 positions shown, 17 images · non-contrast
Comparison: July 04, 2005 CT abdomen posterior to the uterus
measuring 7.3 x 7.6 cm.

CLINICAL DATA: Anemia and lower abdominal pain

EXAM:
CT ABDOMEN AND PELVIS WITHOUT CONTRAST
TECHNIQUE: Multidetector CT imaging of the abdomen and pelvis was performed
following the standard protocol without IV contrast. Intravenous
contrast was not administered due to decreased glomerular filtration
rate. Oral contrast was administered.

[Series 2: routine abd pel without · axial · non-contrast · 0.68mm/px · z∈[-1028,-684]mm · 12 of 80 slices shown, 14 images]
[im 7/80  soft-tissue]
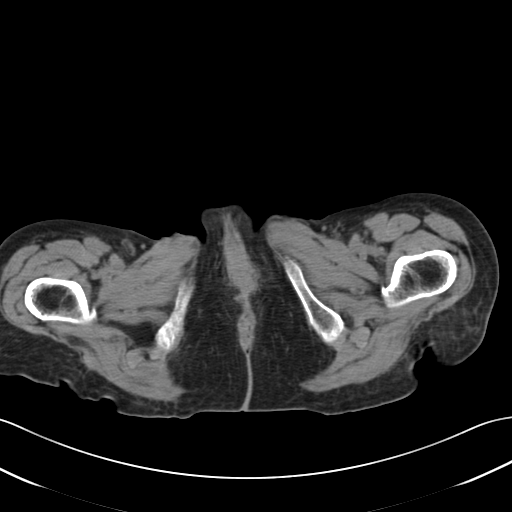
[im 7/80  bone]
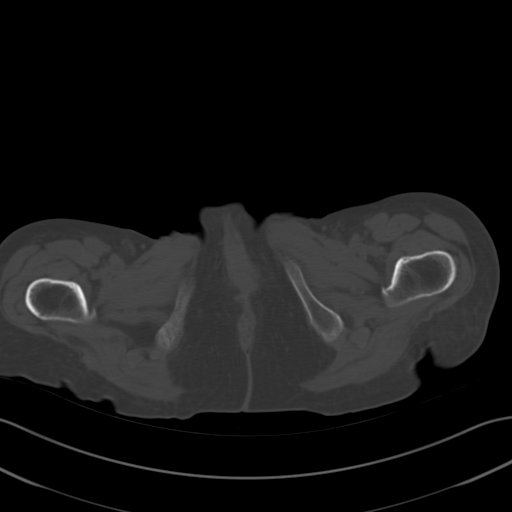
[im 13/80  soft-tissue]
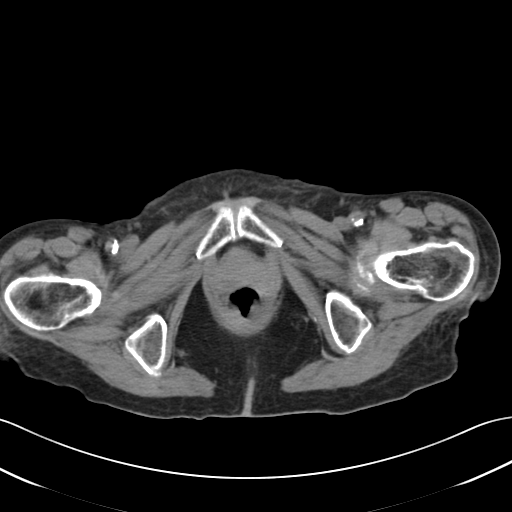
[im 19/80  soft-tissue]
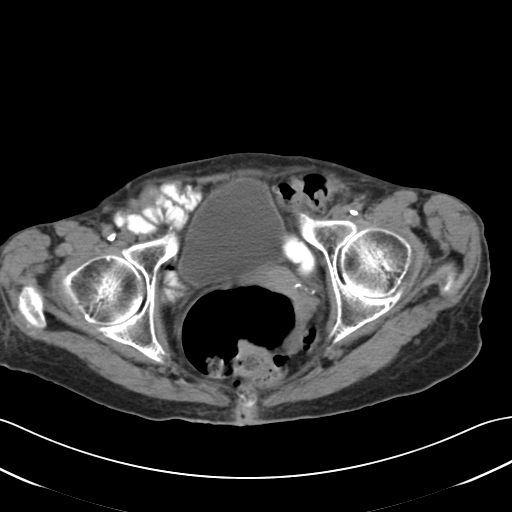
[im 26/80  soft-tissue]
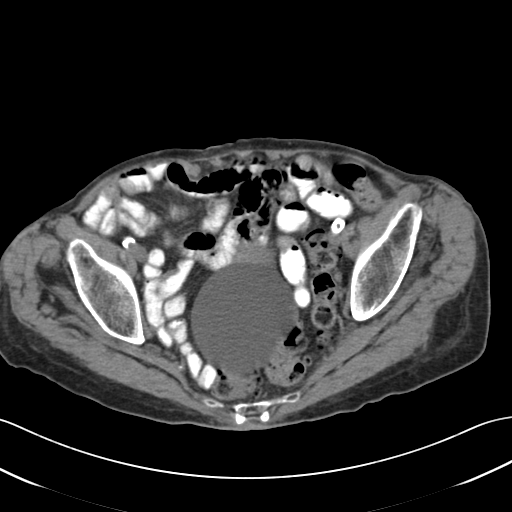
[im 32/80  soft-tissue]
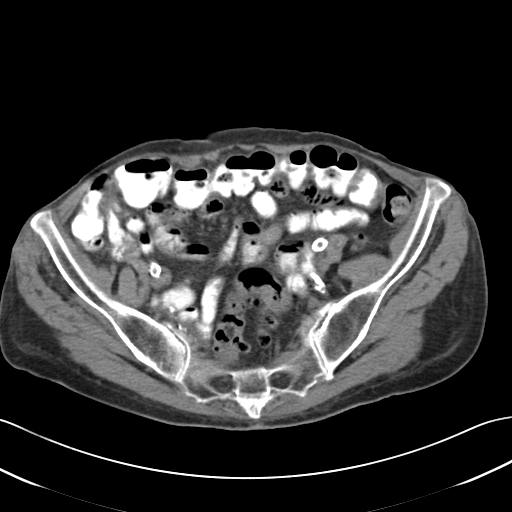
[im 38/80  soft-tissue]
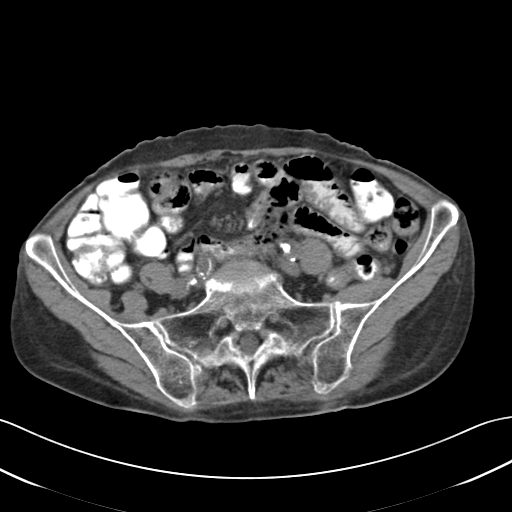
[im 45/80  soft-tissue]
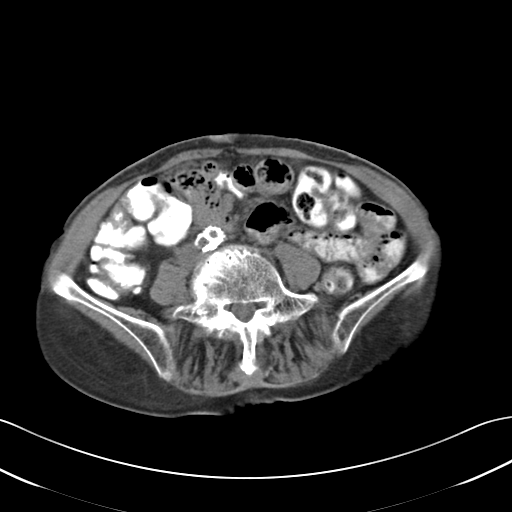
[im 51/80  soft-tissue]
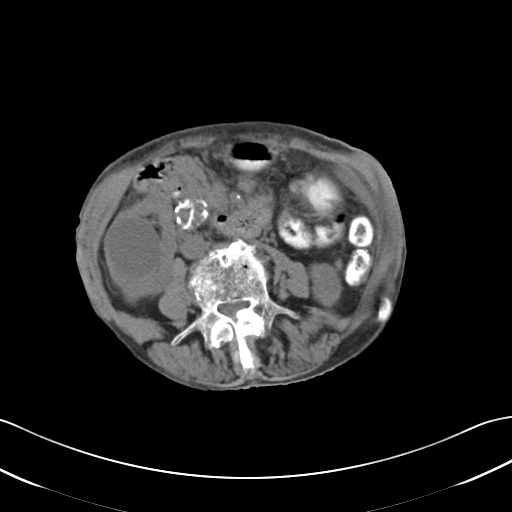
[im 57/80  soft-tissue]
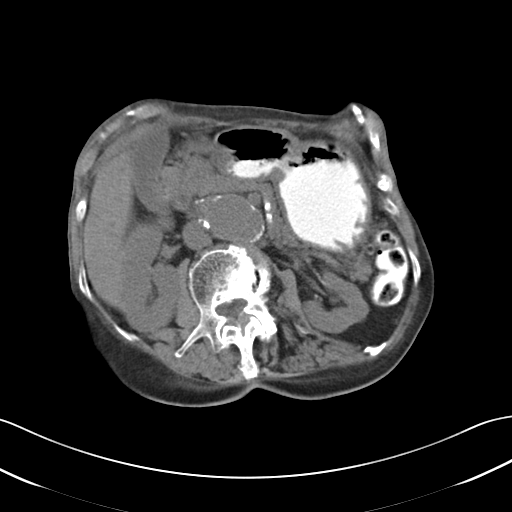
[im 57/80  bone]
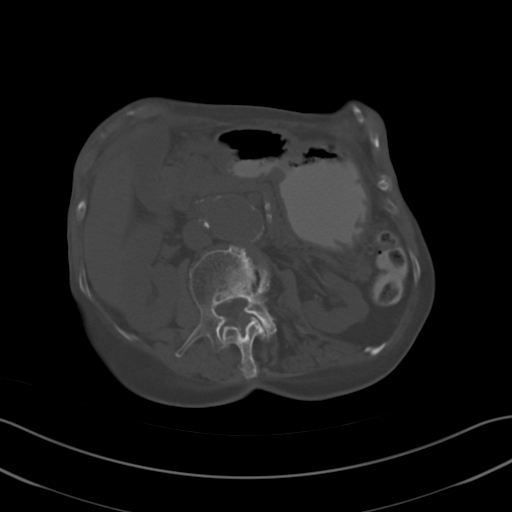
[im 64/80  soft-tissue]
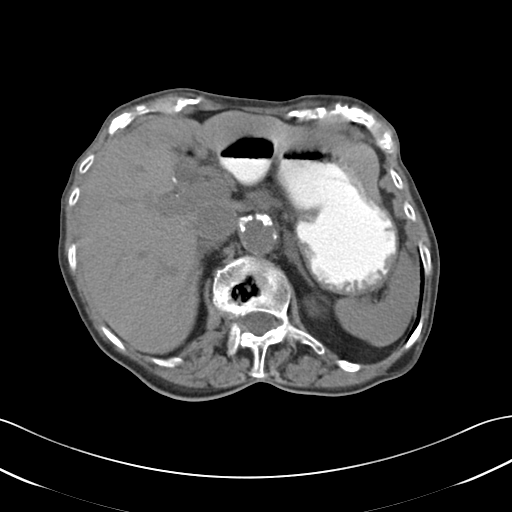
[im 70/80  soft-tissue]
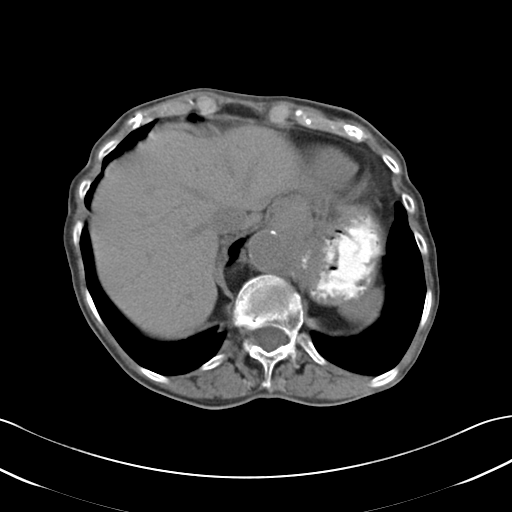
[im 76/80  soft-tissue]
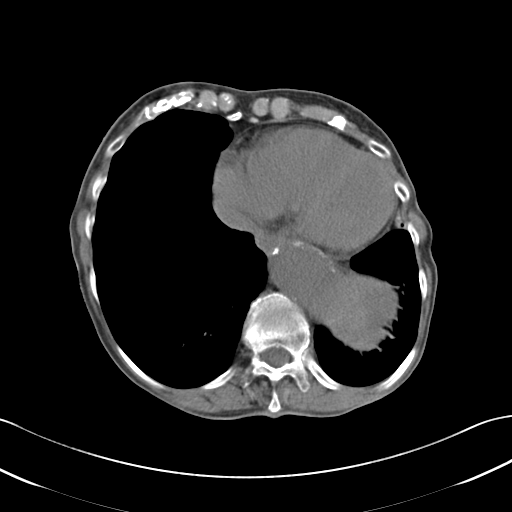

[Series 5: cor routine abd pel wo · coronal · 0.67mm/px · 3 of 112 slices shown]
[im 38/112  soft-tissue]
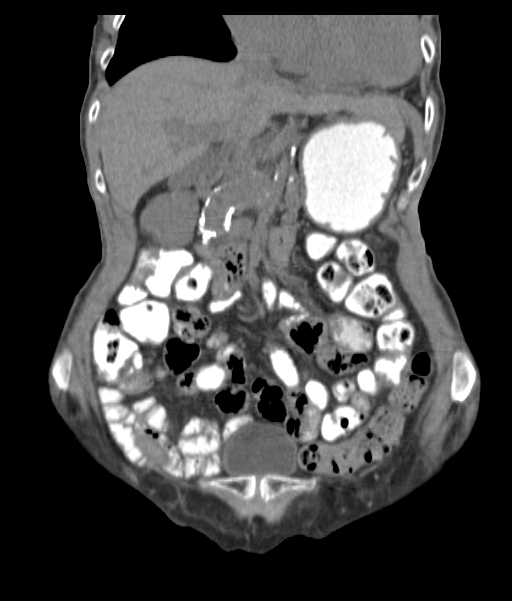
[im 50/112  soft-tissue]
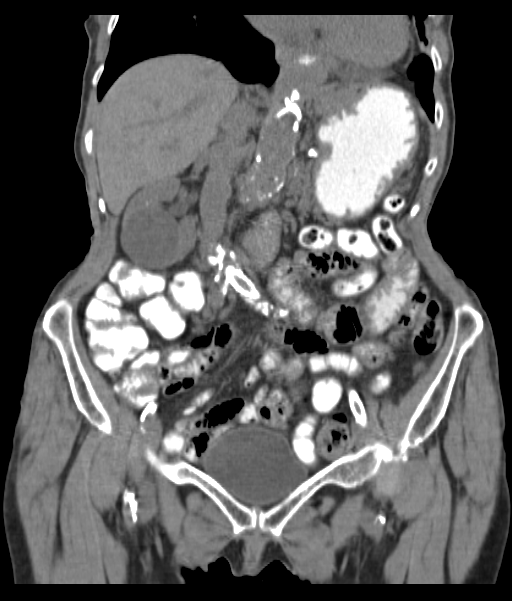
[im 62/112  soft-tissue]
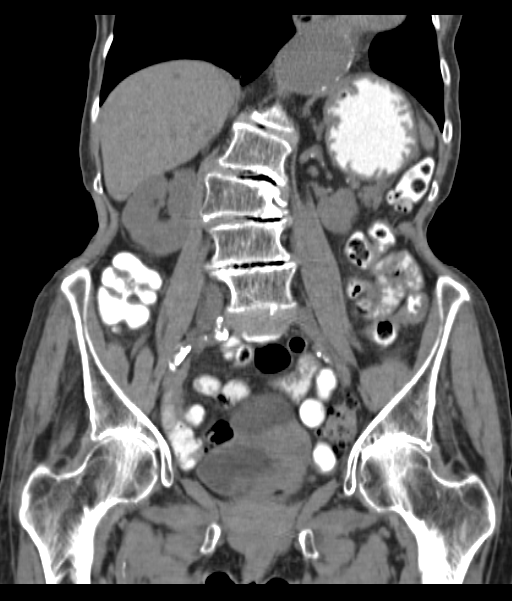

[15 of 46 positions shown; findings below may reference images not displayed]

No other pelvic mass is seen. The urinary
bladder is midline with normal wall thickness.
FINDINGS: There is mild scarring in the left lung base. Lung bases otherwise
appear clear.

No focal liver lesions are identified on this noncontrast enhanced
study. There does appear to be some fatty change near the fissure
for the ligamentum teres. There is no appreciable biliary duct
dilatation. The gallbladder wall is not thickened.

Spleen, pancreas, and adrenals appear unremarkable.

There is a cyst within the mid portion of the right kidney measuring
4.1 x 3.9 cm. No other renal masses are identified on this
noncontrast enhanced study. The right kidney is larger than the left
kidney, a stable finding compared to the prior study. There is no
hydronephrosis on either side. There is a 2 mm nonobstructing
calculus in the mid right kidney. There are no ureteral calculi on
either side. There are multiple foci of arterial vascular
calcification on the left.

The aorta is tortuous with atherosclerotic change. There is marked
aneurysmal dilatation in the descending thoracic aorta measuring
by 7.2 cm, a dramatic change compared to the prior study. There is
aneurysmal dilatation in the distal abdominal aorta with tortuosity.
The maximum transverse diameter of the abdominal aortic aneurysm is
3.6 x 3.2 cm.

In the pelvis, there is a cystic mass between the uterus and rectum
measuring 8.1 x 5.2 x 7.5 cm. No other pelvic mass is seen. There
are multiple sigmoid diverticula without diverticulitis.

There is no bowel obstruction.  No free air or portal venous air.

There is no ascites, adenopathy, or abscess in the abdomen or
pelvis. There is extensive arthropathy throughout the lumbar spine.
There is thoracolumbar dextroscoliosis. There are no blastic or
lytic bone lesions.
IMPRESSION: There is a large descending thoracic aortic aneurysm, incompletely
visualized. There are areas of increased attenuation within this
large aneurysm. Question hemorrhage/contain rupture in this area.
Thoracic CT or MR angiography to further assess this area may be
warranted, although the patient's renal function must of course be
taken into consideration with respect to further imaging.

Large cystic mass in the posterior midline pelvis. Major
differential considerations for this mass include ovarian cyst
versus cystadenoma.

There are sigmoid diverticula without diverticulitis.

No bowel obstruction.  No abscess.

There is a distal abdominal aortic aneurysm.

There is a stable prominent cyst in the right kidney. The left
kidney is smaller the right kidney. Question a degree of renal
artery stenosis on the left.

Critical Value/emergent results were called by telephone at the time
of interpretation on 08/23/2013 at [DATE] to Dr. Oxendine, Sorin
physician at [HOSPITAL], who verbally acknowledged these results. I am
sending this patient directly to the [REDACTED] from the CT suite
given the finding in the lower chest region.
